# Patient Record
Sex: Male | Born: 1980 | Race: Black or African American | Hispanic: No | Marital: Single | Smoking: Current every day smoker
Health system: Southern US, Community
[De-identification: ages and names within clinical notes are randomized; demographics above are authoritative.]

---

## 2011-05-20 ENCOUNTER — Emergency Department (HOSPITAL_COMMUNITY)
Admission: EM | Admit: 2011-05-20 | Discharge: 2011-05-20 | Disposition: A | Discharge status: Home / Self Care | Payer: Medicaid Other | Attending: Emergency Medicine | Admitting: Emergency Medicine

## 2011-05-20 DIAGNOSIS — S61209A Unspecified open wound of unspecified finger without damage to nail, initial encounter: Secondary | ICD-10-CM | POA: Insufficient documentation

## 2011-05-20 DIAGNOSIS — Y92009 Unspecified place in unspecified non-institutional (private) residence as the place of occurrence of the external cause: Secondary | ICD-10-CM | POA: Insufficient documentation

## 2011-05-20 DIAGNOSIS — W278XXA Contact with other nonpowered hand tool, initial encounter: Secondary | ICD-10-CM | POA: Insufficient documentation

## 2011-05-20 DIAGNOSIS — F411 Generalized anxiety disorder: Secondary | ICD-10-CM | POA: Insufficient documentation

## 2011-05-20 DIAGNOSIS — M79609 Pain in unspecified limb: Secondary | ICD-10-CM | POA: Insufficient documentation

## 2013-01-15 ENCOUNTER — Encounter (HOSPITAL_COMMUNITY): Payer: Self-pay | Admitting: *Deleted

## 2013-01-15 ENCOUNTER — Emergency Department (HOSPITAL_COMMUNITY): Payer: Medicaid Other

## 2013-01-15 ENCOUNTER — Emergency Department (HOSPITAL_COMMUNITY)
Admission: EM | Admit: 2013-01-15 | Discharge: 2013-01-15 | Disposition: A | Discharge status: Home / Self Care | Payer: Medicaid Other | Attending: Emergency Medicine | Admitting: Emergency Medicine

## 2013-01-15 DIAGNOSIS — W230XXA Caught, crushed, jammed, or pinched between moving objects, initial encounter: Secondary | ICD-10-CM | POA: Insufficient documentation

## 2013-01-15 DIAGNOSIS — F17200 Nicotine dependence, unspecified, uncomplicated: Secondary | ICD-10-CM | POA: Insufficient documentation

## 2013-01-15 DIAGNOSIS — S8263XA Displaced fracture of lateral malleolus of unspecified fibula, initial encounter for closed fracture: Secondary | ICD-10-CM | POA: Insufficient documentation

## 2013-01-15 DIAGNOSIS — Y9389 Activity, other specified: Secondary | ICD-10-CM | POA: Insufficient documentation

## 2013-01-15 DIAGNOSIS — S8262XA Displaced fracture of lateral malleolus of left fibula, initial encounter for closed fracture: Secondary | ICD-10-CM

## 2013-01-15 DIAGNOSIS — Y9289 Other specified places as the place of occurrence of the external cause: Secondary | ICD-10-CM | POA: Insufficient documentation

## 2013-01-15 MED ORDER — OXYCODONE-ACETAMINOPHEN 5-325 MG PO TABS
2.0000 | ORAL_TABLET | Freq: Once | ORAL | Status: AC
  Administered 2013-01-15: 2 via ORAL
  Filled 2013-01-15: qty 2

## 2013-01-15 MED ORDER — OXYCODONE-ACETAMINOPHEN 5-325 MG PO TABS: 1.0000 | ORAL_TABLET | ORAL | Status: DC | PRN

## 2013-01-15 NOTE — ED Provider Notes (Signed)
History    CSN: [id number] Arrival date & time 01/15/13  1316  First MD Initiated Contact with Patient 01/15/13 1353     Chief Complaint  Patient presents with  . Ankle Pain   (Consider location/radiation/quality/duration/timing/severity/associated sxs/prior Treatment) HPI Comments: 32 y.o. Male with no significant PMHx presents today complaining of left ankle pain and swelling sudden onset yesterday. Pt states he had the car door open, was reaching back to grab some tools, and the door slammed on it. Pt describes pain as severe, sharp, localized to the outside of his ankle, constant, worse with movement/weight bearing. Pt took no interventions. Felt pain and swelling were worsening so came in for evaluation.   Patient is a 32 y.o. male presenting with ankle pain.  Ankle Pain Associated symptoms: no fever    History reviewed. No pertinent past medical history. History reviewed. No pertinent past surgical history. History reviewed. No pertinent family history. History  Substance Use Topics  . Smoking status: Current Every Day Smoker    Types: Cigarettes  . Smokeless tobacco: Not on file  . Alcohol Use: Yes    Review of Systems  Constitutional: Negative for fever.  HENT: Negative for facial swelling.   Eyes: Negative for visual disturbance.  Respiratory: Negative for shortness of breath.   Cardiovascular: Negative for chest pain.  Gastrointestinal: Negative for vomiting.  Genitourinary: Negative for flank pain.  Musculoskeletal: Positive for joint swelling.       Outside of left ankle  Skin: Negative for color change.  Neurological: Negative for weakness and numbness.    Allergies  Review of patient's allergies indicates no known allergies.  Home Medications   Current Outpatient Rx  Name  Route  Sig  Dispense  Refill  . aspirin 325 MG tablet   Oral   Take 325 mg by mouth daily as needed for pain (headache).         . DM-Doxylamine-Acetaminophen (NYQUIL COLD &  FLU PO)   Oral   Take 1 capsule by mouth daily as needed (cold).         Marland Kitchen oxyCODONE-acetaminophen (PERCOCET/ROXICET) 5-325 MG per tablet   Oral   Take 1 tablet by mouth every 4 (four) hours as needed for pain. May take 2 tablets PO q 6 hours for severe pain - Do not take with Tylenol as this tablet already contains tylenol   15 tablet   0    BP 115/67  Pulse 101  Temp(Src) 99.6 F (37.6 C) (Oral)  Resp 16  SpO2 95% Physical Exam  Nursing note and vitals reviewed. Constitutional: He is oriented to person, place, and time. He appears well-developed and well-nourished. No distress.  HENT:  Head: Normocephalic and atraumatic.  Eyes: Conjunctivae and EOM are normal.  Neck: Normal range of motion. Neck supple.  No meningeal signs  Cardiovascular: Normal rate, regular rhythm and normal heart sounds.  Exam reveals no gallop and no friction rub.   No murmur heard. Pulmonary/Chest: Effort normal and breath sounds normal. No respiratory distress. He has no wheezes. He has no rales. He exhibits no tenderness.  Abdominal: Soft. Bowel sounds are normal. He exhibits no distension. There is no tenderness. There is no rebound and no guarding.  Musculoskeletal: Normal range of motion. He exhibits no edema and no tenderness.  Limited ROM to injured extremity secondary to pain   Neurological: He is alert and oriented to person, place, and time. No cranial nerve deficit.  Speech is clear and goal oriented, follows  commands Sensation normal to light touch and two point discrimination intact Moves extremities without ataxia, coordination intact Dorsiflexion and plantar flexion of left foot decreased secondary due to pain  Skin: Skin is warm and dry. He is not diaphoretic. No erythema.  Swelling noted to left lateral malleolus. No bruising. Skin intact.   Psychiatric: He has a normal mood and affect.    ED Course  Procedures (including critical care time) Labs Reviewed - No data to display Dg  Ankle Complete Left  01/15/2013   *RADIOLOGY REPORT*  Clinical Data: Lateral ankle pain after rolling injury.  LEFT ANKLE COMPLETE - 3+ VIEW  Comparison: None.  Findings: Prominent lateral malleolar soft tissue swelling with questionable subtle left lateral malleolar nondisplaced fracture.  No other fracture or dislocation is noted.  IMPRESSION: Prominent lateral malleolar soft tissue swelling with questionable subtle left lateral malleolar nondisplaced fracture.   Original Report Authenticated By: Lacy Duverney, M.D.   1. Lateral malleolar fracture, left, closed, initial encounter     MDM  Neurovascularly intact. Two point discrimination intact. Imaging confirms left lateral malleolar nondisplaced fracture. Ankle splint with posterior stirrup applied. Crutches. Pain meds. Ortho referral. Discussed reasons to seek immediate care. Patient expresses understanding and agrees with plan.   Glade Nurse, PA-C 01/15/13 2109

## 2013-01-15 NOTE — ED Notes (Signed)
To ED for eval of left ankle pain and swelling since injuring ankle yesterday when a car door slammed on it. Swelling noted. Color wnl.

## 2013-01-15 NOTE — Progress Notes (Signed)
Orthopedic Tech Progress Note Patient Details:  Jared [name] 1981/01/21 [id number]  Ortho Devices Type of Ortho Device: Short leg splint Ortho Device/Splint Interventions: Application   [name], Jared Bail 01/15/2013, 2:51 PM

## 2013-01-15 NOTE — ED Notes (Signed)
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 Swelling and discoloration noted.

## 2013-01-15 NOTE — Discharge Instructions (Signed)
1. Make appointment with the orthopedist this week.  2. You may take up to 800 mg of ibuprofen (this is typically 4 over the counter pills) up to three times a day for your pain. For breakthrough pain you may take your prescribed Percocet. Do not take other medicines containing acetaminophen as Percocet contains acetaminophen (read the labels!). You may take Valium before bed as a muscle relaxer. Do not drink alcohol, drive, or operate heavy machinery while taking Percocet. 3. SEEK IMMEDIATE MEDICAL CARE IF:  Your cast gets damaged or breaks.  You have continued severe pain or more swelling than you did before the cast was put on.  Your skin or toenails below the injury turn blue or gray, or feel cold or numb.  There is a bad smell or new stains and/or purulent (pus like) drainage coming from under the cast.

## 2013-01-15 NOTE — ED Notes (Signed)
Ortho tech called 

## 2013-01-19 NOTE — ED Provider Notes (Signed)
History/physical exam/procedure(s) were performed by non-physician practitioner and as supervising physician I was immediately available for consultation/collaboration. I have reviewed all notes and am in agreement with care and plan.   Sayyid Harewood S Gray Maugeri, MD 01/19/13 1228 

## 2014-07-31 ENCOUNTER — Encounter (HOSPITAL_COMMUNITY): Payer: Self-pay | Admitting: Emergency Medicine

## 2014-07-31 ENCOUNTER — Emergency Department (HOSPITAL_COMMUNITY): Payer: Medicaid Other

## 2014-07-31 ENCOUNTER — Emergency Department (HOSPITAL_COMMUNITY)
Admission: EM | Admit: 2014-07-31 | Discharge: 2014-07-31 | Disposition: A | Discharge status: Home / Self Care | Payer: Medicaid Other | Attending: Emergency Medicine | Admitting: Emergency Medicine

## 2014-07-31 DIAGNOSIS — R220 Localized swelling, mass and lump, head: Secondary | ICD-10-CM

## 2014-07-31 DIAGNOSIS — R0602 Shortness of breath: Secondary | ICD-10-CM | POA: Insufficient documentation

## 2014-07-31 DIAGNOSIS — K047 Periapical abscess without sinus: Secondary | ICD-10-CM

## 2014-07-31 DIAGNOSIS — Z79899 Other long term (current) drug therapy: Secondary | ICD-10-CM | POA: Insufficient documentation

## 2014-07-31 DIAGNOSIS — Z7982 Long term (current) use of aspirin: Secondary | ICD-10-CM | POA: Insufficient documentation

## 2014-07-31 DIAGNOSIS — R42 Dizziness and giddiness: Secondary | ICD-10-CM | POA: Insufficient documentation

## 2014-07-31 DIAGNOSIS — Z720 Tobacco use: Secondary | ICD-10-CM | POA: Insufficient documentation

## 2014-07-31 LAB — I-STAT CREATININE, ED: Creatinine, Ser: 1 mg/dL (ref 0.50–1.35)

## 2014-07-31 MED ORDER — OXYCODONE-ACETAMINOPHEN 5-325 MG PO TABS: 1.0000 | ORAL_TABLET | Freq: Four times a day (QID) | ORAL | Status: AC | PRN

## 2014-07-31 MED ORDER — OXYCODONE-ACETAMINOPHEN 5-325 MG PO TABS
2.0000 | ORAL_TABLET | Freq: Once | ORAL | Status: AC
  Administered 2014-07-31: 2 via ORAL
  Filled 2014-07-31: qty 2

## 2014-07-31 MED ORDER — IOHEXOL 300 MG/ML  SOLN
100.0000 mL | Freq: Once | INTRAMUSCULAR | Status: AC | PRN
  Administered 2014-07-31: 100 mL via INTRAVENOUS

## 2014-07-31 MED ORDER — PENICILLIN V POTASSIUM 500 MG PO TABS: 500.0000 mg | ORAL_TABLET | Freq: Four times a day (QID) | ORAL | Status: AC

## 2014-07-31 MED ORDER — PENICILLIN V POTASSIUM 500 MG PO TABS
500.0000 mg | ORAL_TABLET | Freq: Once | ORAL | Status: AC
  Administered 2014-07-31: 500 mg via ORAL
  Filled 2014-07-31: qty 1

## 2014-07-31 MED ORDER — KETOROLAC TROMETHAMINE 30 MG/ML IJ SOLN
30.0000 mg | Freq: Once | INTRAMUSCULAR | Status: AC
  Administered 2014-07-31: 30 mg via INTRAVENOUS
  Filled 2014-07-31: qty 1

## 2014-07-31 NOTE — Progress Notes (Signed)
  CARE MANAGEMENT ED NOTE 07/31/2014  Patient:  Jared [name],Jared [name]   Account Number:  [id number]  Date Initiated:  07/31/2014  Documentation initiated by:  Radford PaxFERRERO,Beauregard Jarrells  Subjective/Objective Assessment:   Patient presemtnts to Ed with tooth pain  left facial swelling     Subjective/Objective Assessment Detail:     Action/Plan:   Action/Plan Detail:   Anticipated DC Date:       Status Recommendation to Physician:   Result of Recommendation:    Other ED Services  Consult Working Plan    DC Planning Services  Other  PCP issues    Choice offered to / List presented to:            Status of service:  Completed, signed off  ED Comments:   ED Comments Detail:  EDCM spoke to patient at bedside. Patient confirms he does not have a pcp or insurance [place]. EDCM provide patient with pamphlet to [facility], informed patient of services there and walk in times.  EDCM also provided patient with list of pcps who accept self pay patients, list of discount pharmacies and websites needymeds.org and GoodRX.com for medication assistance, phone number to inquire about the orange card, phone number to inquire about Mediciad, phone number to inquire about the Affordable Care Act, financial resources in the community such as local churches, [employer], urban ministries, and dental assistance for uninsured patients. Informed patient prescription ofr antibiotic is free at [employer]. Patient thankful for resources.  No further EDCM needs at this time.

## 2014-07-31 NOTE — ED Notes (Signed)
Patient reports left facial swelling. Decreased appetite x2 days. Had teeth pulled 7-8 months ago. Does report chills, possible fever. Denies N/V/D. Has tried tylenol and ibuprofen for pain with no alleviation of symptoms. RR even/unlabored.

## 2014-07-31 NOTE — ED Provider Notes (Signed)
CSN: [id number]     Arrival date & time 07/31/14  1824 History   None  This chart was scribed for non-physician practitioner, Antony Madura, PA, working with Rolland Porter, MD by Marica Otter, ED Scribe. This patient was seen in room WTR7/WTR7 and the patient's care was started at 8:10 PM.   Chief Complaint  Patient presents with  . Facial Swelling   The history is provided by the patient. No language interpreter was used.   PCP: No PCP Per Patient HPI Comments: Jared [name] is a 34 y.o. male, with Hx of daily tobacco use, who presents to the Emergency Department complaining of acute, atraumatic, worsening left sided facial swelling with associated decreased intake PO due to facial pain, facial pain, subjective fever, SOB, dizziness and chills onset 4 days ago. Pt is unable to describe the sensation of his facial pain but rates his pain an 8 out of 10. Pt reports using tylenol and ibuprofen at home with no relief; pt notes he last took ibuprofen at noon today. Pt further reports icing his face to alleviate the pain and swelling without relief. Pt denies n/v/d. Pt denies any known allergies to any antibiotics.    History reviewed. No pertinent past medical history. History reviewed. No pertinent past surgical history. History reviewed. No pertinent family history. History  Substance Use Topics  . Smoking status: Current Every Day Smoker    Types: Cigarettes  . Smokeless tobacco: Not on file  . Alcohol Use: Yes    Review of Systems  Constitutional: Positive for fever, chills and appetite change (decreased appetitie ).  HENT: Positive for facial swelling (left facial swelling with associated facial pain ).   Respiratory: Positive for shortness of breath.   Gastrointestinal: Negative for nausea, vomiting and diarrhea.  Neurological: Positive for dizziness.  Psychiatric/Behavioral: Negative for confusion.  All other systems reviewed and are negative.   Allergies  Review of patient's  allergies indicates no known allergies.  Home Medications   Prior to Admission medications   Medication Sig Start Date End Date Taking? Authorizing Provider  aspirin 325 MG tablet Take 325 mg by mouth daily as needed for pain (headache).    Historical Provider, MD  DM-Doxylamine-Acetaminophen (NYQUIL COLD & FLU PO) Take 1 capsule by mouth daily as needed (cold).    Historical Provider, MD  oxyCODONE-acetaminophen (PERCOCET/ROXICET) 5-325 MG per tablet Take 1-2 tablets by mouth every 6 (six) hours as needed for moderate pain or severe pain. 07/31/14   Antony Madura, PA-C  penicillin v potassium (VEETID) 500 MG tablet Take 1 tablet (500 mg total) by mouth 4 (four) times daily. 07/31/14 08/07/14  Antony Madura, PA-C   Triage Vitals: BP 131/78 mmHg  Pulse 82  Temp(Src) 98.5 F (36.9 C) (Oral)  Resp 17  SpO2 100%  Physical Exam  Constitutional: He is oriented to person, place, and time. He appears well-developed and well-nourished. No distress.  Nontoxic/nonseptic appearing  HENT:  Head: Normocephalic and atraumatic.  Left Ear: Tympanic membrane, external ear and ear canal normal.  Mouth/Throat: Uvula is midline, oropharynx is clear and moist and mucous membranes are normal. There is trismus in the jaw. Dental abscesses present. No uvula swelling.  Left-sided facial swelling with moderate trismus. Patient tolerating secretions without difficulty or drooling. No gingival fluctuance appreciated. No purulent drainage or oral lesions.  Eyes: Conjunctivae and EOM are normal. No scleral icterus.  Neck: Normal range of motion. Neck supple.  No nuchal rigidity or meningismus. There is mild, tender  anterior cervical lymphadenopathy on the left.  Pulmonary/Chest: Effort normal. No respiratory distress.  Respirations even and unlabored  Musculoskeletal: Normal range of motion.  Neurological: He is alert and oriented to person, place, and time. He exhibits normal muscle tone. Coordination normal.  Skin: Skin  is warm and dry. No rash noted. He is not diaphoretic. No erythema. No pallor.  Psychiatric: He has a normal mood and affect. His behavior is normal.  Nursing note and vitals reviewed.   ED Course  Procedures (including critical care time) DIAGNOSTIC STUDIES: Oxygen Saturation is 100% on RA, nl by my interpretation.    COORDINATION OF CARE: 8:15 PM-Discussed treatment plan which includes CT Scan, meds and labs with pt at bedside and pt agreed to plan.   Labs Review Labs Reviewed  I-STAT CREATININE, ED    Imaging Review Ct Soft Tissue Neck W Contrast  07/31/2014   CLINICAL DATA:  Left-sided facial pain and swelling for days. Wisdom teeth removed 8 months ago.  EXAM: CT NECK WITH CONTRAST  TECHNIQUE: Multidetector CT imaging of the neck was performed using the standard protocol following the bolus administration of intravenous contrast.  CONTRAST:  100mL OMNIPAQUE IOHEXOL 300 MG/ML  SOLN  COMPARISON:  None.  FINDINGS: There is evidence of patient's previous bilateral lower wisdom teeth removal. There are 2 small fragments over the donor site of the left lower wisdom tooth with the larger measuring 7 mm as well as 2 tiny adjacent foci of air. Immediately superficial to the left lower wisdom tooth resection is an oval hypodense collection measuring 1.2 x 2.3 cm compatible with small adjacent abscess. There is mild adjacent subcutaneous edema. No definite adjacent bone destruction.  Pharynx and larynx: Within normal.  Salivary glands: Within normal.  Thyroid: Within normal.  Lymph nodes: Reactive lymph nodes in the left submandibular region.  Vascular: Within normal.  Limited intracranial: Within normal.  Mastoids and visualized paranasal sinuses: Within normal.  Skeleton: Within normal.  Upper chest: Within normal.  IMPRESSION: 1.2 x 2.3 cm hypodense collection immediately superficial to the left lower wisdom tooth resection site compatible with a small adjacent abscess. There is mild associated  subcutaneous edema. There also 2 tiny fragments over the left lower wisdom tooth donor site with the larger measuring 7 mm as well as 2 small foci of air.   Electronically Signed   By: Elberta Fortisaniel  Boyle M.D.   On: 07/31/2014 21:04     EKG Interpretation None      MDM   Final diagnoses:  Facial swelling  Dental abscess    Patient with toothache x 4 days with facial swelling. He is afebrile; no antipyretics taken in the last 8 hours. Moderate trismus noted on exam; therefore, CT imaging pursued. This shows findings consistent with abscess adjacent to site of left lower wisdom tooth resection. No other complicating features identified. Imaging reviewed by myself and my attending, Dr. Rolland PorterMark James. Exam unconcerning for Ludwig's angina. Will treat with penicillin and pain medicine. Urged patient to follow-up with dentist. Referral and resource guide provided. Patient agreeable to plan with no unaddressed concerns.  I personally performed the services described in this documentation, which was scribed in my presence. The recorded information has been reviewed and is accurate.   Filed Vitals:   07/31/14 1905  BP: 131/78  Pulse: 82  Temp: 98.5 F (36.9 C)  TempSrc: Oral  Resp: 17  SpO2: 100%      Antony MaduraKelly Odetta Forness, PA-C 07/31/14 2142  Rolland PorterMark James, MD 08/12/14  0009 

## 2014-07-31 NOTE — Discharge Instructions (Signed)
Dental Abscess °A dental abscess is a collection of infected fluid (pus) from a bacterial infection in the inner part of the tooth (pulp). It usually occurs at the end of the tooth's root.  °CAUSES  °· Severe tooth decay. °· Trauma to the tooth that allows bacteria to enter into the pulp, such as a broken or chipped tooth. °SYMPTOMS  °· Severe pain in and around the infected tooth. °· Swelling and redness around the abscessed tooth or in the mouth or face. °· Tenderness. °· Pus drainage. °· Bad breath. °· Bitter taste in the mouth. °· Difficulty swallowing. °· Difficulty opening the mouth. °· Nausea. °· Vomiting. °· Chills. °· Swollen neck glands. °DIAGNOSIS  °· A medical and dental history will be taken. °· An examination will be performed by tapping on the abscessed tooth. °· X-rays may be taken of the tooth to identify the abscess. °TREATMENT °The goal of treatment is to eliminate the infection. You may be prescribed antibiotic medicine to stop the infection from spreading. A root canal may be performed to save the tooth. If the tooth cannot be saved, it may be pulled (extracted) and the abscess may be drained.  °HOME CARE INSTRUCTIONS °· Only take over-the-counter or prescription medicines for pain, fever, or discomfort as directed by your caregiver. °· Rinse your mouth (gargle) often with salt water (¼ tsp salt in 8 oz [250 ml] of warm water) to relieve pain or swelling. °· Do not drive after taking pain medicine (narcotics). °· Do not apply heat to the outside of your face. °· Return to your dentist for further treatment as directed. °SEEK MEDICAL CARE IF: °· Your pain is not helped by medicine. °· Your pain is getting worse instead of better. °SEEK IMMEDIATE MEDICAL CARE IF: °· You have a fever or persistent symptoms for more than 2-3 days. °· You have a fever and your symptoms suddenly get worse. °· You have chills or a very bad headache. °· You have problems breathing or swallowing. °· You have trouble  opening your mouth. °· You have swelling in the neck or around the eye. °Document Released: 07/11/2005 Document Revised: 04/04/2012 Document Reviewed: 10/19/2010 °ExitCare® Patient Information ©2015 [employer]. This information is not intended to replace advice given to you by your health care provider. Make sure you discuss any questions you have with your health care provider. ° °Emergency Department Resource Guide °1) Find a Doctor and Pay Out of Pocket °Although you won't have to find out who is covered by your insurance plan, it is a good idea to ask around and get recommendations. You will then need to call the office and see if the doctor you have chosen will accept you as a new patient and what types of options they offer for patients who are self-pay. Some doctors offer discounts or will set up payment plans for their patients who do not have insurance, but you will need to ask so you aren't surprised when you get to your appointment. ° °2) Contact Your Local Health Department °Not all health departments have doctors that can see patients for sick visits, but many do, so it is worth a call to see if yours does. If you don't know where your local health department is, you can check in your phone book. The CDC also has a tool to help you locate your state's health department, and many state websites also have listings of all of their local health departments. ° °3) Find a Walk-in Clinic °  If your illness is not likely to be very severe or complicated, you may want to try a walk in clinic. These are popping up all over the country in pharmacies, drugstores, and shopping centers. They're usually staffed by nurse practitioners or physician assistants that have been trained to treat common illnesses and complaints. They're usually fairly quick and inexpensive. However, if you have serious medical issues or chronic medical problems, these are probably not your best option. ° °No Primary Care Doctor: °- Call  [facility] at  832-8000 - they can help you locate a primary care doctor that  accepts your insurance, provides certain services, etc. °- Physician Referral Service- 1-800-533-3463 ° °Chronic Pain Problems: °Organization         Address  Phone   Notes  °[facility]  ([phone]) [phone] Patients need to be referred by their primary care doctor.  ° °Medication Assistance: °Organization         Address  Phone   Notes  °[place] [facility] [address] °[place], [place] [address] [phone]) 641-8030 --Must be a resident of [place] °-- Must have NO insurance coverage whatsoever (no Medicaid/ Medicare, etc.) °-- The pt. MUST have a primary care doctor that directs their care regularly and follows them in the community °  °[facility]  (866) 331-1348   °[employer]  (888) 892-1162   ° °Agencies that provide inexpensive medical care: °Organization         Address  Phone   Notes  °[facility]  ([phone]) [phone]   °[facility]    (336) 832-7272   °[facility] [address] °[place], [place] [address] (336) 832-4777   °[facility] [address], °[place] (336) [phone]   °[facility]    (336) 373-0678   °[facility]    (336) 272-1050   °[facility] ° [address], [place] Phone:  (336) [phone], Fax:  ([phone]) [phone] Hours of Operation:  9 am - 6 pm, M-F.  Also accepts Medicaid/Medicare and self-pay.  °[facility] ° [address], [place] Phone: ([phone]) [phone], Fax: ([phone]) [phone]. Hours of Operation:  8:30 am - 5:30 pm, M-F.  Also accepts Medicaid and self-pay.  °[facility] [place] [address], [place] Phone: ([phone]) [phone]   °[facility] [address], [place], [place] (336)[phone], Ext. [phone] Mondays & Thursdays: 7-9 AM.  First 15 patients are seen on a first come, first serve  basis. °  ° °Medicaid-accepting [place] Providers: ° °Organization         Address  Phone   Notes  °[facility] [address], [place] ([phone]) [phone] Also accepts self-pay patients.  °[facility] [address], [place] ° (336) [phone]   °[facility] [address], [place] ([phone]) [phone]   °[facility] [address] [place] [address], [place] ([phone]) [phone]   °[facility] [address], [place]  ° ([phone]) [phone] Only accepts [place] Access Medicaid patients after they have their name applied to their card.  ° °Self-Pay (no insurance) in [place]: ° °Organization         Address  Phone   Notes  °Sickle Cell Patients, [facility] [address], [place] ([phone])   [phone]   °[facility] [address], [place] ([phone]) [phone]   °[facility] ° [address], [place] ([phone]) [phone]   °[facility]/Dr. Osei-Bonsu ° [address] [place] [address], [place] or [address], [place] [phone]) [phone] Phone number for both [place] and [place] locations is the same.  °[facility] [address], [place] ([phone]) [phone]   °[facility] [address] [place] [address], [place] or [address] ([phone]) [phone] °(336) [phone]   °[facility] [address], [place] (336) [phone], phone; ([phone]) [phone], fax Sees patients 1st and 3rd Saturday of every month.  Must not qualify for public or private insurance (i.e. Medicaid, Medicare, [place] Health Choice, Veterans' Benefits) • Household income should be no more than 200% of the poverty level •The clinic cannot treat you if you are pregnant or think you are pregnant • Sexually transmitted diseases are not treated at the clinic.  ° ° °Dental Care: °Organization         Address  Phone  Notes  °[facility] [address], [place] ([phone]) [phone] Accepts children up to age 21 who are enrolled in Medicaid or [place] Health Choice; pregnant women with a Medicaid card; and children who have applied for Medicaid or [place] Health Choice, but were declined, whose parents can pay a reduced fee at time of service.  °[facility] [place]  [address], [place] (336) [phone] Accepts children up to age 21 who are enrolled in Medicaid or [place] Health Choice; pregnant women with a Medicaid card; and children who have applied for Medicaid or [place] Health Choice, but were declined, whose parents can pay a reduced fee at time of service.  °[facility] ° [address], [place] ([phone]) [phone] Patients are seen by appointment only. Walk-ins are not accepted. [facility] will see patients 18 years of age and older. °Monday - Tuesday (8am-5pm) °Most Wednesdays (8:30-5pm) °$30 per visit, cash only  °[facility] ° [address], [place] ([phone]) [phone] Patients are seen by appointment only. Walk-ins are not accepted. [facility] will see patients 18 years of age and older. °One Wednesday Evening (Monthly: Volunteer Based).  $30 per visit, cash only  °[facility]  ([phone]) [phone] for adults; Children under age 4, call Graduate Pediatric Dentistry at ([phone]) [phone]. Children aged 4-14, please call [phone]) [phone] to request a pediatric application. ° Dental services are provided in all areas of dental care including fillings, crowns and bridges, complete and partial dentures, implants, gum treatment, root canals, and extractions. Preventive care is also provided. Treatment is provided to both adults and children. °Patients are selected via a lottery and there is often a waiting list. °  °[facility] [address], °[place] ° ([phone]) [phone]  [email or web address] °  °[facility] [address], [place], [place] (336)[phone], Ext. 123 Second and Fourth Thursday of each month, opens at 6:30 AM; Clinic ends at 9 AM.  Patients are seen on a first-come first-served basis, and a limited number are seen during each clinic.  ° °[facility] ° [address], [place], [place] ([phone]) [phone]   Eligibility Requirements °You must have lived in [place], [place], or [place] counties for   at least the last three months. °  You cannot be eligible for state or federal sponsored healthcare insurance, including Veterans Administration, Medicaid, or Medicare. °  You generally cannot be eligible for healthcare insurance through your employer.  °  How to apply: °Eligibility screenings are held every Tuesday and Wednesday afternoon from 1:00 pm until 4:00 pm. You do not need an appointment for the interview!  °[facility] [address], [place], [place] [phone]   °[facility]  [phone]   °[facility]  [phone]   ° [facility]  [phone]   ° °

## 2016-07-29 IMAGING — CT CT NECK W/ CM
4 of 5 series · 16 of 33 positions shown, 18 images · IV contrast (OMNIPAQUE 300)
Comparison: None.

CLINICAL DATA: Left-sided facial pain and swelling for days. Wisdom
teeth removed 8 months ago.

EXAM:
CT NECK WITH CONTRAST
TECHNIQUE: Multidetector CT imaging of the neck was performed using the
standard protocol following the bolus administration of intravenous
contrast.
CONTRAST:  100mL OMNIPAQUE IOHEXOL 300 MG/ML  SOLN

[Series 2: neck with st · axial · 0.44mm/px · z∈[-269,-127]mm · 4 of 119 slices shown]
[im 24/119  bone]
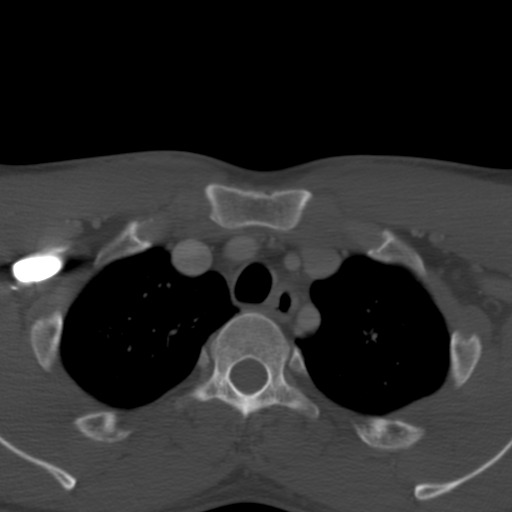
[im 48/119  bone]
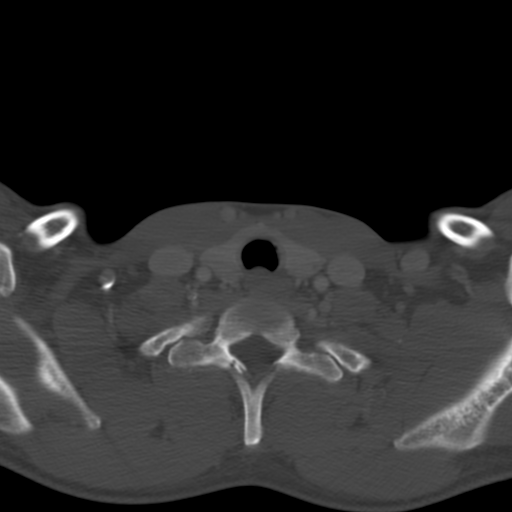
[im 71/119  bone]
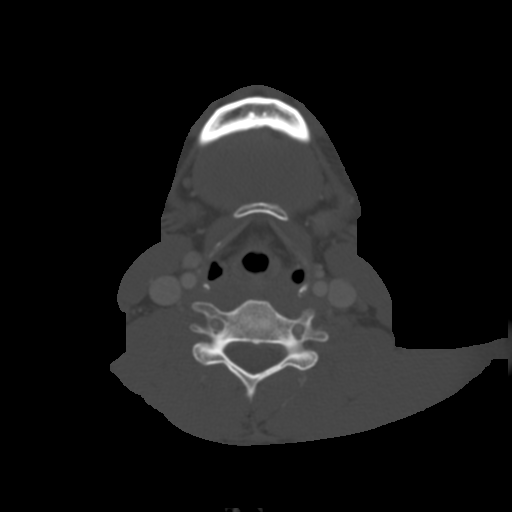
[im 95/119  bone]
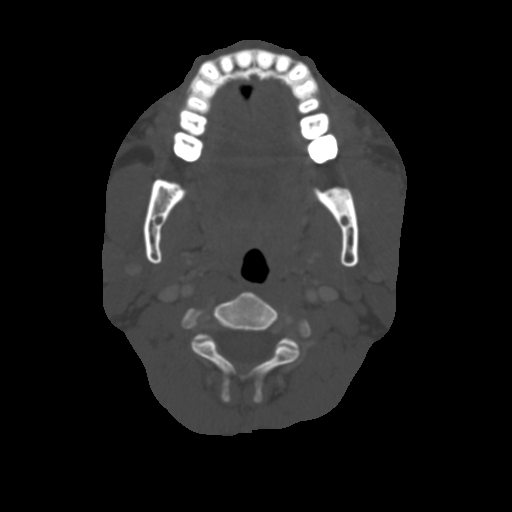

[Series 602: <mpr thick range> · sagittal · 0.46mm/px · 5 of 101 slices shown, 6 images]
[im 34/101  bone]
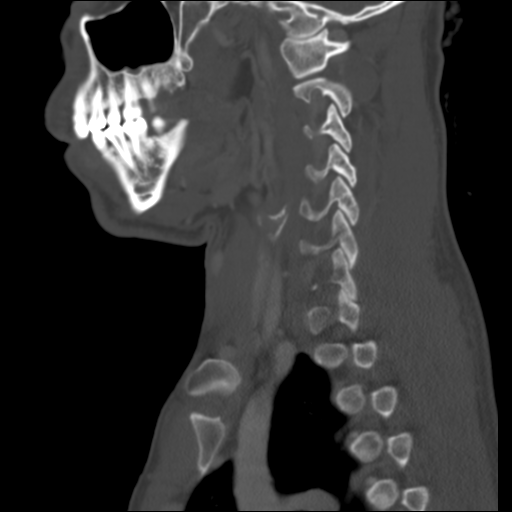
[im 42/101  bone]
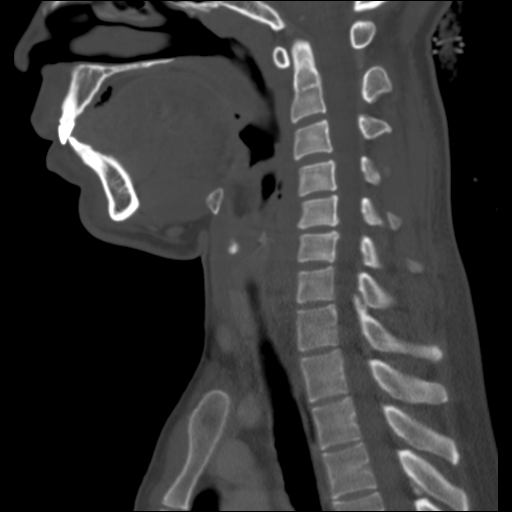
[im 51/101  soft-tissue]
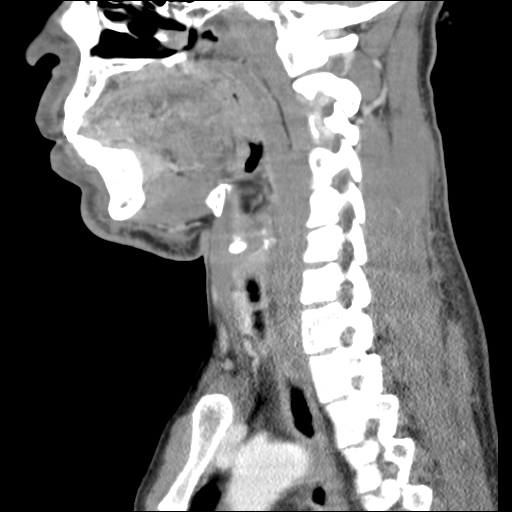
[im 51/101  bone]
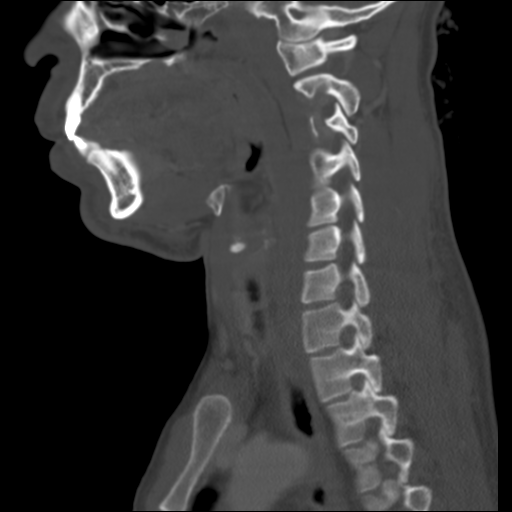
[im 59/101  bone]
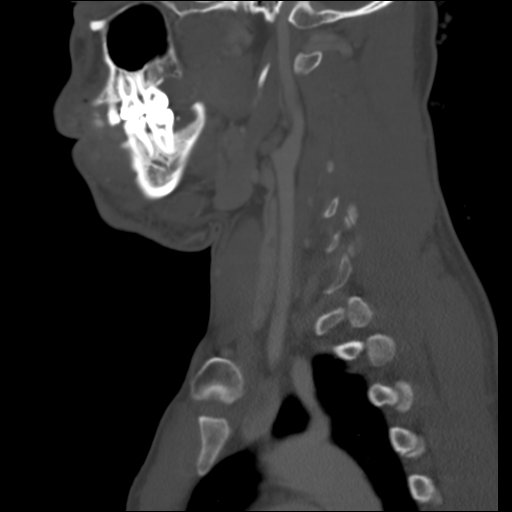
[im 67/101  bone]
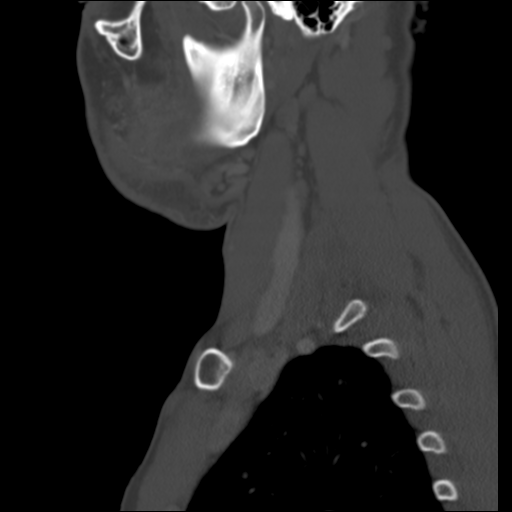

[Series 603: <mpr thick range(1)> · coronal · 0.46mm/px · 3 of 98 slices shown]
[im 20/98  bone]
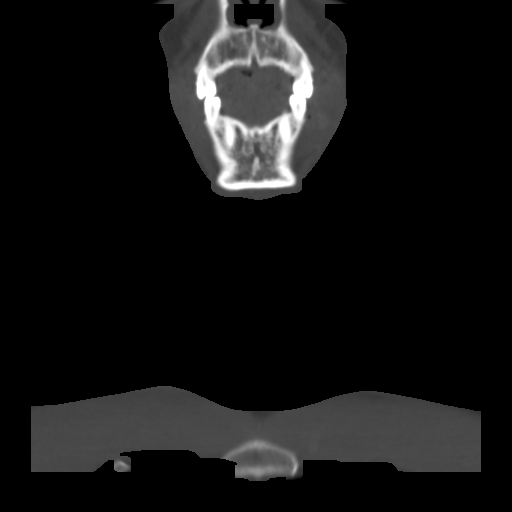
[im 39/98  bone]
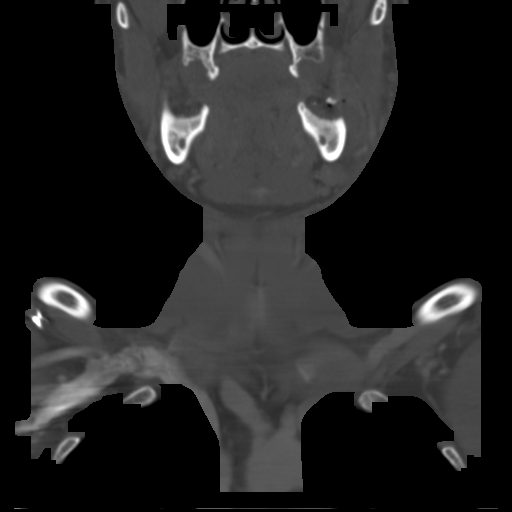
[im 59/98  bone]
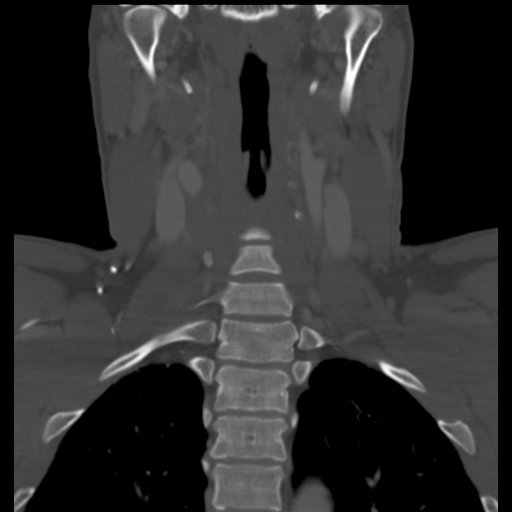

[Series 604: <mpr thick range(2)> · axial · 0.46mm/px · z∈[-309,-146]mm · 4 of 141 slices shown, 5 images]
[im 29/141  soft-tissue]
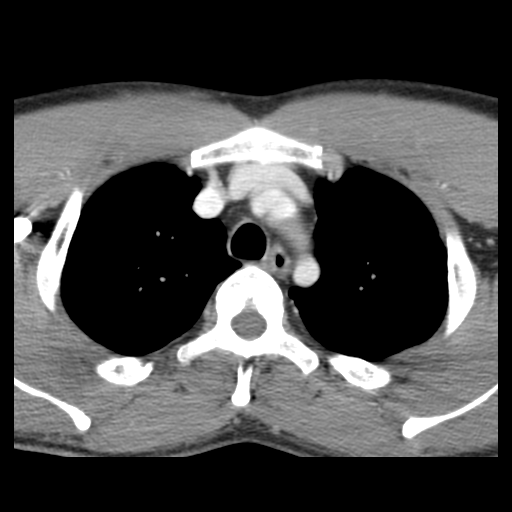
[im 29/141  bone]
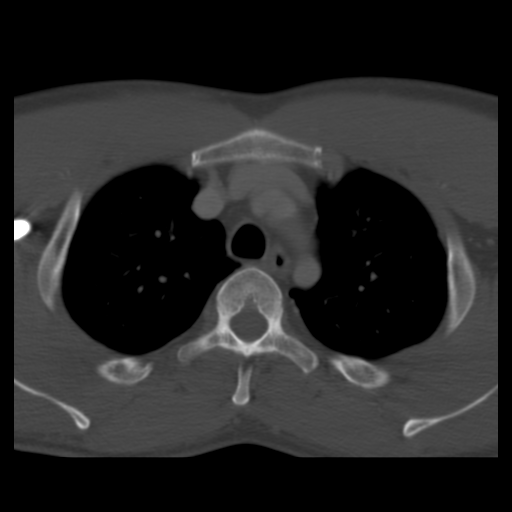
[im 57/141  bone]
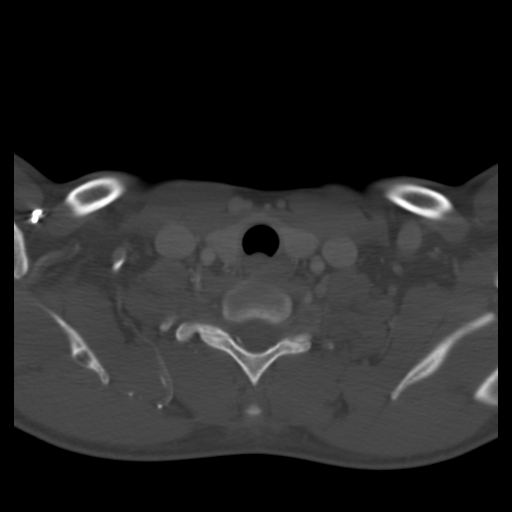
[im 85/141  bone]
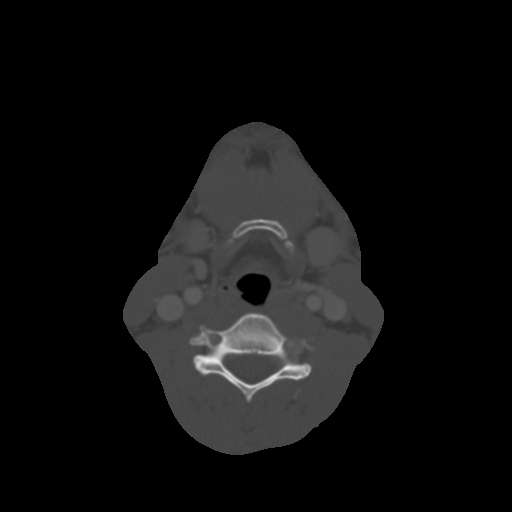
[im 113/141  bone]
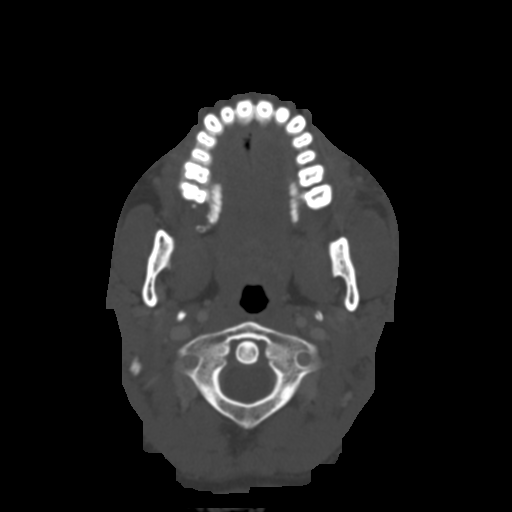

[16 of 33 positions shown; findings below may reference images not displayed]

FINDINGS: There is evidence of patient's previous bilateral lower wisdom teeth
removal. There are 2 small fragments over the donor site of the left
lower wisdom tooth with the larger measuring 7 mm as well as 2 tiny
adjacent foci of air. Immediately superficial to the left lower
wisdom tooth resection is an oval hypodense collection measuring
x 2.3 cm compatible with small adjacent abscess. There is mild
adjacent subcutaneous edema. No definite adjacent bone destruction.

Pharynx and larynx: Within normal.

Salivary glands: Within normal.

Thyroid: Within normal.

Lymph nodes: Reactive lymph nodes in the left submandibular region.

Vascular: Within normal.

Limited intracranial: Within normal.

Mastoids and visualized paranasal sinuses: Within normal.

Skeleton: Within normal.

Upper chest: Within normal.
IMPRESSION: 1.2 x 2.3 cm hypodense collection immediately superficial to the
left lower wisdom tooth resection site compatible with a small
adjacent abscess. There is mild associated subcutaneous edema. There
also 2 tiny fragments over the left lower wisdom tooth donor site
with the larger measuring 7 mm as well as 2 small foci of air.

## 2018-12-24 DEATH — deceased
